# Patient Record
Sex: Female | Born: 1990
Health system: Southern US, Community
[De-identification: ages and names within clinical notes are randomized; demographics above are authoritative.]

---

## 2011-01-25 ENCOUNTER — Encounter: Payer: Self-pay | Admitting: *Deleted

## 2011-01-25 ENCOUNTER — Emergency Department (HOSPITAL_BASED_OUTPATIENT_CLINIC_OR_DEPARTMENT_OTHER)
Admission: EM | Admit: 2011-01-25 | Discharge: 2011-01-25 | Disposition: A | Discharge status: Home / Self Care | Payer: Self-pay | Attending: Emergency Medicine | Admitting: Emergency Medicine

## 2011-01-25 DIAGNOSIS — J029 Acute pharyngitis, unspecified: Secondary | ICD-10-CM | POA: Insufficient documentation

## 2011-01-25 DIAGNOSIS — J45909 Unspecified asthma, uncomplicated: Secondary | ICD-10-CM | POA: Insufficient documentation

## 2011-01-25 LAB — CBC
HCT: 38 % (ref 36.0–46.0)
Hemoglobin: 12.8 g/dL (ref 12.0–15.0)
MCH: 29.8 pg (ref 26.0–34.0)
MCV: 88.4 fL (ref 78.0–100.0)
RBC: 4.3 MIL/uL (ref 3.87–5.11)

## 2011-01-25 MED ORDER — PENICILLIN V POTASSIUM 500 MG PO TABS: 500.0000 mg | ORAL_TABLET | Freq: Four times a day (QID) | ORAL | Status: AC

## 2011-01-25 NOTE — ED Notes (Signed)
Pt c/o sore throat x 2 weeks
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
## 2013-09-23 ENCOUNTER — Ambulatory Visit: Payer: Self-pay | Admitting: Cardiovascular Disease

## 2015-12-28 ENCOUNTER — Encounter (HOSPITAL_COMMUNITY): Payer: Self-pay | Admitting: Emergency Medicine

## 2015-12-28 ENCOUNTER — Emergency Department (HOSPITAL_COMMUNITY)
Admission: EM | Admit: 2015-12-28 | Discharge: 2015-12-28 | Disposition: A | Discharge status: Home / Self Care | Payer: BLUE CROSS/BLUE SHIELD | Attending: Emergency Medicine | Admitting: Emergency Medicine

## 2015-12-28 DIAGNOSIS — H109 Unspecified conjunctivitis: Secondary | ICD-10-CM | POA: Insufficient documentation

## 2015-12-28 DIAGNOSIS — J45909 Unspecified asthma, uncomplicated: Secondary | ICD-10-CM | POA: Diagnosis not present

## 2015-12-28 DIAGNOSIS — T7840XA Allergy, unspecified, initial encounter: Secondary | ICD-10-CM

## 2015-12-28 LAB — CBC WITH DIFFERENTIAL/PLATELET
BASOS ABS: 0 10*3/uL (ref 0.0–0.1)
Basophils Relative: 0 %
EOS PCT: 1 %
Eosinophils Absolute: 0.1 10*3/uL (ref 0.0–0.7)
HEMATOCRIT: 42.3 % (ref 36.0–46.0)
HEMOGLOBIN: 13.8 g/dL (ref 12.0–15.0)
LYMPHS ABS: 2.4 10*3/uL (ref 0.7–4.0)
LYMPHS PCT: 41 %
MCH: 30.2 pg (ref 26.0–34.0)
MCHC: 32.6 g/dL (ref 30.0–36.0)
MCV: 92.6 fL (ref 78.0–100.0)
Monocytes Absolute: 0.4 10*3/uL (ref 0.1–1.0)
Monocytes Relative: 7 %
NEUTROS ABS: 2.9 10*3/uL (ref 1.7–7.7)
Neutrophils Relative %: 51 %
PLATELETS: 299 10*3/uL (ref 150–400)
RBC: 4.57 MIL/uL (ref 3.87–5.11)
RDW: 13.2 % (ref 11.5–15.5)
WBC: 5.8 10*3/uL (ref 4.0–10.5)

## 2015-12-28 LAB — BASIC METABOLIC PANEL
ANION GAP: 9 (ref 5–15)
CHLORIDE: 104 mmol/L (ref 101–111)
CO2: 21 mmol/L — AB (ref 22–32)
Calcium: 8.7 mg/dL — ABNORMAL LOW (ref 8.9–10.3)
Creatinine, Ser: 0.84 mg/dL (ref 0.44–1.00)
GFR calc Af Amer: >60 mL/min (ref >60)
GLUCOSE: 82 mg/dL (ref 65–99)
POTASSIUM: 3.5 mmol/L (ref 3.5–5.1)
Sodium: 134 mmol/L — ABNORMAL LOW (ref 135–145)

## 2015-12-28 LAB — I-STAT BETA HCG BLOOD, ED (MC, WL, AP ONLY): I-stat hCG, quantitative: <5 m[IU]/mL (ref <5)

## 2015-12-28 MED ORDER — PREDNISONE 10 MG PO TABS: 20.0000 mg | ORAL_TABLET | Freq: Two times a day (BID) | ORAL | 0 refills | Status: DC

## 2015-12-28 MED ORDER — SULFACETAMIDE SODIUM 10 % OP SOLN: 1.0000 [drp] | Freq: Four times a day (QID) | OPHTHALMIC | 0 refills | Status: DC

## 2015-12-28 MED ORDER — ONDANSETRON HCL 8 MG PO TABS: 8.0000 mg | ORAL_TABLET | ORAL | 0 refills | Status: DC | PRN

## 2015-12-28 MED ORDER — EPINEPHRINE HCL 1 MG/ML IJ SOLN
0.3000 mg | Freq: Once | INTRAMUSCULAR | Status: AC
Start: 2015-12-28 — End: 2015-12-28
  Administered 2015-12-28: 0.3 mg via SUBCUTANEOUS
  Filled 2015-12-28: qty 1

## 2015-12-28 MED ORDER — DIPHENHYDRAMINE HCL 50 MG/ML IJ SOLN
25.0000 mg | Freq: Once | INTRAMUSCULAR | Status: AC
  Administered 2015-12-28: 25 mg via INTRAVENOUS
  Filled 2015-12-28: qty 1

## 2015-12-28 MED ORDER — ONDANSETRON HCL 4 MG/2ML IJ SOLN
4.0000 mg | Freq: Once | INTRAMUSCULAR | Status: AC
  Administered 2015-12-28: 4 mg via INTRAVENOUS
  Filled 2015-12-28: qty 2

## 2015-12-28 MED ORDER — METHYLPREDNISOLONE SODIUM SUCC 125 MG IJ SOLR
125.0000 mg | Freq: Once | INTRAMUSCULAR | Status: AC
  Administered 2015-12-28: 125 mg via INTRAVENOUS
  Filled 2015-12-28: qty 2

## 2015-12-28 NOTE — Discharge Instructions (Signed)
Gentamicin eyedrops as prescribed.  Prednisone as prescribed.  Take Benadryl 25 mg every 6 hours for the next 2-3 days.  Zofran as prescribed as needed for nausea.  Return to the ER if your symptoms significantly worsen or change.

## 2015-12-28 NOTE — ED Notes (Signed)
Pt reports understanding of discharge information. No questions at time of discharge 

## 2015-12-28 NOTE — ED Triage Notes (Signed)
Pt states that she started having an allergic reaction tonight approx. 1 hour ago. States she did take tramadol tonight because she has pink eye. SOB. Alert and oriented.

## 2015-12-28 NOTE — ED Provider Notes (Signed)
[facility]-EMERGENCY DEPT Provider Note   CSN: [id number] Arrival date & time: 12/28/15  0023  First Provider Contact:12:57 AM    By signing my name below, I, Casey Luna, attest that this documentation has been prepared under the direction and in the presence of Geoffery Lyons, MD.  Electronically Signed: Gillis Ends. Lyn Hollingshead, ED Scribe. 12/28/15. 1:41 AM.   History   Chief Complaint Chief Complaint  Patient presents with  . Allergic Reaction    HPI HPI Comments: Casey Luna is a 25 y.o. female who presents to the Emergency Department complaining of sudden onset, constant, worsening, urticaria that began about 2hrs PTA. Pt has associated throat itching. She reports having symptoms consistent with pink eye which began on 12/19/15. She was prescribed Tramadol by ophthalmologist due to viral infection. Pt reports using eye drops for eye redness with no relief. She consumed garlic which she believes she contributed to allergic reaction. No recent use of new detergents, lotions, or perfumes. She reports recently applying new bed sheets to her bed. Pt has not been wearing contact lenses due to eye symptoms. No alleviating factors noted.   The history is provided by the patient. No language interpreter was used.  Allergic Reaction  Presenting symptoms: itching, rash and swelling   Rash:    Location:  Neck, arm, back, abdomen and leg   Quality: redness     Onset quality:  Sudden   Progression:  Worsening Severity:  Moderate Duration:  2 hours Ineffective treatments:  None tried  Past Medical History:  Diagnosis Date  . Asthma     There are no active problems to display for this patient.   History reviewed. No pertinent surgical history.  OB History    No data available       Home Medications    Prior to Admission medications   Medication Sig Start Date End Date Taking? Authorizing Provider  albuterol (PROVENTIL HFA;VENTOLIN HFA) 108 (90 BASE) MCG/ACT inhaler Inhale 2  puffs into the lungs every 6 (six) hours as needed. Shortness of breath and wheezing    Historical Provider, MD    Family History History reviewed. No pertinent family history.  Social History Social History  Substance Use Topics  . Smoking status: Never Smoker  . Smokeless tobacco: Not on file  . Alcohol use No     Allergies   Review of patient's allergies indicates no known allergies.   Review of Systems Review of Systems  Eyes: Positive for discharge and redness.  Skin: Positive for color change, itching and rash.  Allergic/Immunologic: Positive for food allergies.  All other systems reviewed and are negative.   Physical Exam Updated Vital Signs BP 134/92 (BP Location: Right Arm)   Pulse 78   Temp 97.7 F (36.5 C) (Oral)   Resp 20   SpO2 98%   Physical Exam  Constitutional: She is oriented to person, place, and time. She appears well-developed and well-nourished. No distress.  HENT:  Head: Normocephalic and atraumatic.  Eyes: EOM are normal.  Neck: Normal range of motion.  Cardiovascular: Normal rate, regular rhythm and normal heart sounds.   Pulmonary/Chest: Effort normal and breath sounds normal.  Abdominal: Soft. She exhibits no distension. There is no tenderness.  Musculoskeletal: Normal range of motion.  Neurological: She is alert and oriented to person, place, and time.  Skin: Skin is warm and dry.  Urticarial rash to arms, legs, and torso.  Psychiatric: She has a normal mood and affect. Judgment normal.  Nursing note  and vitals reviewed.   ED Treatments / Results  DIAGNOSTIC STUDIES: Oxygen Saturation is 98% on RA, normal by my interpretation.    COORDINATION OF CARE: 1:05 AM-Discussed treatment plan with pt at bedside and pt agreed to plan.   Labs (all labs ordered are listed, but only abnormal results are displayed) Labs Reviewed - No data to display  Radiology No results found.  Procedures Procedures (including critical care  time)  Medications Ordered in ED Medications  methylPREDNISolone sodium succinate (SOLU-MEDROL) 125 mg/2 mL injection 125 mg (not administered)  diphenhydrAMINE (BENADRYL) injection 25 mg (not administered)  EPINEPHrine (ADRENALIN) injection 0.3 mg (not administered)    Initial Impression / Assessment and Plan / ED Course  I have reviewed the triage vital signs and the nursing notes.  Pertinent labs & imaging results that were available during my care of the patient were reviewed by me and considered in my medical decision making (see chart for details).  Clinical Course     Final Clinical Impressions(s) / ED Diagnoses   Final diagnoses:  None    New Prescriptions New Prescriptions   No medications on file   Patient presents with complaints of an allergic reaction. She broke out in hives this evening and itching. There is no airway involvement in her vital signs are stable. She reports being started recently on tramadol for red, swollen eyes which she was told was a viral conjunctivitis.  She was given IV steroids, Benadryl, and epinephrine and her rash has significantly improved. She is now feeling better. She does report feeling nauseated all week. Her pregnancy test is negative and laboratory studies are unremarkable.  I will prescribe medication for her nausea and I feel as though it is time to prescribe an antibiotic drop for her eyes. She has injected conjunctiva with purulent discharge. She is to follow-up with her primary Dr. if not improving.   I personally performed the services described in this documentation, which was scribed in my presence. The recorded information has been reviewed and is accurate.       Geoffery Lyons, MD 12/28/15 [phone]

## 2017-03-06 ENCOUNTER — Emergency Department (HOSPITAL_BASED_OUTPATIENT_CLINIC_OR_DEPARTMENT_OTHER): Payer: BLUE CROSS/BLUE SHIELD

## 2017-03-06 ENCOUNTER — Encounter (HOSPITAL_BASED_OUTPATIENT_CLINIC_OR_DEPARTMENT_OTHER): Payer: Self-pay

## 2017-03-06 ENCOUNTER — Emergency Department (HOSPITAL_BASED_OUTPATIENT_CLINIC_OR_DEPARTMENT_OTHER)
Admission: EM | Admit: 2017-03-06 | Discharge: 2017-03-06 | Disposition: A | Discharge status: Home / Self Care | Payer: BLUE CROSS/BLUE SHIELD | Attending: Emergency Medicine | Admitting: Emergency Medicine

## 2017-03-06 DIAGNOSIS — M545 Low back pain, unspecified: Secondary | ICD-10-CM

## 2017-03-06 DIAGNOSIS — Z79899 Other long term (current) drug therapy: Secondary | ICD-10-CM | POA: Diagnosis not present

## 2017-03-06 DIAGNOSIS — Y999 Unspecified external cause status: Secondary | ICD-10-CM | POA: Insufficient documentation

## 2017-03-06 DIAGNOSIS — S161XXA Strain of muscle, fascia and tendon at neck level, initial encounter: Secondary | ICD-10-CM | POA: Diagnosis not present

## 2017-03-06 DIAGNOSIS — Y939 Activity, unspecified: Secondary | ICD-10-CM | POA: Insufficient documentation

## 2017-03-06 DIAGNOSIS — S199XXA Unspecified injury of neck, initial encounter: Secondary | ICD-10-CM | POA: Diagnosis present

## 2017-03-06 DIAGNOSIS — Y929 Unspecified place or not applicable: Secondary | ICD-10-CM | POA: Diagnosis not present

## 2017-03-06 DIAGNOSIS — J45909 Unspecified asthma, uncomplicated: Secondary | ICD-10-CM | POA: Insufficient documentation

## 2017-03-06 MED ORDER — MELOXICAM 7.5 MG PO TABS: 7.5000 mg | ORAL_TABLET | Freq: Every day | ORAL | 0 refills | Status: DC

## 2017-03-06 MED ORDER — HYDROCODONE-ACETAMINOPHEN 5-325 MG PO TABS
1.0000 | ORAL_TABLET | Freq: Once | ORAL | Status: AC
  Administered 2017-03-06: 1 via ORAL
  Filled 2017-03-06: qty 1

## 2017-03-06 MED ORDER — CYCLOBENZAPRINE HCL 10 MG PO TABS: 10.0000 mg | ORAL_TABLET | Freq: Every evening | ORAL | 0 refills | Status: DC | PRN

## 2017-03-06 MED FILL — MELOXICAM 7.5 MG TABLET: 7.5 | 15 days supply | Qty: 15 | Fill #0

## 2017-03-06 MED FILL — CYCLOBENZAPRINE 10 MG TAB: 10 | 10 days supply | Qty: 10 | Fill #0
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
## 2017-03-06 NOTE — Discharge Instructions (Signed)
Please read instructions below. Talk with your PCP about any new medications.  You can take meloxicam daily as needed for pain.  You can take Flexeril at bedtime as needed for muscle spasm.  Drink plenty of water. Apply ice to your neck and back for 20 minutes at a time. You can also apply heat if this provides you with more relief.  Return to ER if worsening headache, vision changes, vomiting, new numbness or tingling in your arms or legs, inability to urinate, inability to hold your bowels, or weakness in your extremities.
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
## 2017-03-06 NOTE — ED Notes (Signed)
Patient transported to X-ray 

## 2020-02-26 ENCOUNTER — Other Ambulatory Visit (HOSPITAL_COMMUNITY): Payer: Self-pay | Admitting: Gastroenterology

## 2020-02-26 ENCOUNTER — Other Ambulatory Visit: Payer: Self-pay | Admitting: Gastroenterology

## 2020-02-26 DIAGNOSIS — R1011 Right upper quadrant pain: Secondary | ICD-10-CM

## 2020-03-05 ENCOUNTER — Other Ambulatory Visit: Payer: Self-pay

## 2020-03-05 ENCOUNTER — Ambulatory Visit (HOSPITAL_COMMUNITY)
Admission: RE | Admit: 2020-03-05 | Discharge: 2020-03-05 | Disposition: A | Discharge status: Home / Self Care | Payer: BC Managed Care – PPO | Source: Ambulatory Visit | Attending: Gastroenterology | Admitting: Gastroenterology

## 2020-03-05 DIAGNOSIS — R1011 Right upper quadrant pain: Secondary | ICD-10-CM | POA: Insufficient documentation

## 2020-03-12 ENCOUNTER — Other Ambulatory Visit: Payer: Self-pay

## 2020-03-12 ENCOUNTER — Encounter (HOSPITAL_COMMUNITY)
Admission: RE | Admit: 2020-03-12 | Discharge: 2020-03-12 | Disposition: A | Discharge status: Home / Self Care | Payer: BC Managed Care – PPO | Source: Ambulatory Visit | Attending: Gastroenterology | Admitting: Gastroenterology

## 2020-03-12 DIAGNOSIS — R1011 Right upper quadrant pain: Secondary | ICD-10-CM | POA: Insufficient documentation

## 2020-03-12 MED ORDER — TECHNETIUM TC 99M MEBROFENIN IV KIT
5.4000 | PACK | Freq: Once | INTRAVENOUS | Status: AC | PRN
  Administered 2020-03-12: 5.4 via INTRAVENOUS

## 2021-04-09 ENCOUNTER — Other Ambulatory Visit: Payer: Self-pay

## 2021-04-09 ENCOUNTER — Emergency Department (HOSPITAL_BASED_OUTPATIENT_CLINIC_OR_DEPARTMENT_OTHER)
Admission: EM | Admit: 2021-04-09 | Discharge: 2021-04-09 | Disposition: A | Discharge status: Home / Self Care | Payer: BC Managed Care – PPO | Attending: Emergency Medicine | Admitting: Emergency Medicine

## 2021-04-09 ENCOUNTER — Emergency Department (HOSPITAL_BASED_OUTPATIENT_CLINIC_OR_DEPARTMENT_OTHER): Payer: BC Managed Care – PPO

## 2021-04-09 DIAGNOSIS — M940 Chondrocostal junction syndrome [Tietze]: Secondary | ICD-10-CM | POA: Diagnosis not present

## 2021-04-09 DIAGNOSIS — R142 Eructation: Secondary | ICD-10-CM | POA: Diagnosis not present

## 2021-04-09 DIAGNOSIS — J45909 Unspecified asthma, uncomplicated: Secondary | ICD-10-CM | POA: Diagnosis not present

## 2021-04-09 DIAGNOSIS — R079 Chest pain, unspecified: Secondary | ICD-10-CM | POA: Diagnosis present

## 2021-04-09 DIAGNOSIS — R110 Nausea: Secondary | ICD-10-CM | POA: Insufficient documentation

## 2021-04-09 LAB — CBC WITH DIFFERENTIAL/PLATELET
Abs Immature Granulocytes: 0.01 10*3/uL (ref 0.00–0.07)
Basophils Absolute: 0 10*3/uL (ref 0.0–0.1)
Basophils Relative: 1 %
Eosinophils Absolute: 0.1 10*3/uL (ref 0.0–0.5)
Eosinophils Relative: 2 %
HCT: 35.8 % — ABNORMAL LOW (ref 36.0–46.0)
Hemoglobin: 11.8 g/dL — ABNORMAL LOW (ref 12.0–15.0)
Immature Granulocytes: 0 %
Lymphocytes Relative: 40 %
Lymphs Abs: 2.5 10*3/uL (ref 0.7–4.0)
MCH: 28.9 pg (ref 26.0–34.0)
MCHC: 33 g/dL (ref 30.0–36.0)
MCV: 87.7 fL (ref 80.0–100.0)
Monocytes Absolute: 0.4 10*3/uL (ref 0.1–1.0)
Monocytes Relative: 6 %
Neutro Abs: 3.1 10*3/uL (ref 1.7–7.7)
Neutrophils Relative %: 51 %
Platelets: 318 10*3/uL (ref 150–400)
RBC: 4.08 MIL/uL (ref 3.87–5.11)
RDW: 13.2 % (ref 11.5–15.5)
WBC: 6.1 10*3/uL (ref 4.0–10.5)
nRBC: 0 % (ref 0.0–0.2)

## 2021-04-09 LAB — BASIC METABOLIC PANEL
Anion gap: 6 (ref 5–15)
BUN: 10 mg/dL (ref 6–20)
CO2: 25 mmol/L (ref 22–32)
Calcium: 8.7 mg/dL — ABNORMAL LOW (ref 8.9–10.3)
Chloride: 105 mmol/L (ref 98–111)
Creatinine, Ser: 0.9 mg/dL (ref 0.44–1.00)
GFR, Estimated: >60 mL/min (ref >60)
Glucose, Bld: 87 mg/dL (ref 70–99)
Potassium: 3.7 mmol/L (ref 3.5–5.1)
Sodium: 136 mmol/L (ref 135–145)

## 2021-04-09 LAB — TROPONIN I (HIGH SENSITIVITY): Troponin I (High Sensitivity): <2 ng/L (ref <18)

## 2021-04-09 MED ORDER — ONDANSETRON HCL 4 MG/2ML IJ SOLN
4.0000 mg | Freq: Once | INTRAMUSCULAR | Status: AC
  Administered 2021-04-09: 4 mg via INTRAVENOUS
  Filled 2021-04-09: qty 2

## 2021-04-09 MED ORDER — ONDANSETRON 8 MG PO TBDP: 8.0000 mg | ORAL_TABLET | Freq: Three times a day (TID) | ORAL | 0 refills | Status: AC | PRN

## 2021-04-09 MED ORDER — NAPROXEN 375 MG PO TABS: ORAL_TABLET | ORAL | 0 refills | Status: AC

## 2021-04-09 MED ORDER — KETOROLAC TROMETHAMINE 15 MG/ML IJ SOLN
15.0000 mg | Freq: Once | INTRAMUSCULAR | Status: AC
  Administered 2021-04-09: 15 mg via INTRAVENOUS
  Filled 2021-04-09: qty 1

## 2021-04-09 MED ORDER — PANTOPRAZOLE SODIUM 40 MG IV SOLR
40.0000 mg | Freq: Once | INTRAVENOUS | Status: AC
  Administered 2021-04-09: 40 mg via INTRAVENOUS
  Filled 2021-04-09: qty 40

## 2021-04-09 NOTE — ED Triage Notes (Signed)
Pt c/o chest pain for 2-3 weeks, but started to get worse yesterday. Pt describes pain as a discomfort that radiates towards left arm.

## 2021-04-09 NOTE — ED Provider Notes (Signed)
[facility] Provider Note: Casey Dell, MD, FACEP  CSN: [id number] MRN: [id number] ARRIVAL: 04/09/21 at 0454 ROOM: MH09/[facility]09   CHIEF COMPLAINT  Chest Pain   HISTORY OF PRESENT ILLNESS  04/09/21 5:07 AM Casey Luna is a 30 y.o. female who has had general malaise and fatigue for about 2 to 3 weeks.  She has had an occasional cough with this.  She is also having nausea and belching.  She has been having intermittent pain in her left chest.  It is localized along the left sternal border but radiates to the left arm.  It is intermittent.  It is vaguely described but has both sharp and dull components.  It is it is worse with palpation or movements.  Is worse when lying on her left side.  It is worse with taking deep breaths and when the pain occurs she feels somewhat short of breath.  She rates it as a 7 out of 10 currently.   Past Medical History:  Diagnosis Date   Asthma     No past surgical history on file.  No family history on file.  Social History   Tobacco Use   Smoking status: Never  Substance Use Topics   Alcohol use: No   Drug use: No    Prior to Admission medications   Medication Sig Start Date End Date Taking? Authorizing Provider  naproxen (NAPROSYN) 375 MG tablet Take 1 tablet twice daily as needed for chest wall pain.  Take with food if stomach upset occurs. 04/09/21  Yes Macyn Shropshire, MD  ondansetron (ZOFRAN ODT) 8 MG disintegrating tablet Take 1 tablet (8 mg total) by mouth every 8 (eight) hours as needed for nausea or vomiting. 04/09/21  Yes Drey Shaff, MD    Allergies Patient has no known allergies.   REVIEW OF SYSTEMS  Negative except as noted here or in the History of Present Illness.   PHYSICAL EXAMINATION  Initial Vital Signs Blood pressure (!) 138/108, pulse 67, temperature 98.5 F (36.9 C), resp. rate 18, height 4\' 11"  (1.499 m), weight 63.5 kg, last menstrual period 04/06/2021, SpO2 100 %.  Examination General:  Well-developed, well-nourished female in no acute distress; appearance consistent with age of record HENT: normocephalic; atraumatic Eyes: Normal appearance Neck: supple Heart: regular rate and rhythm Lungs: clear to auscultation bilaterally Chest: Left parasternal tenderness Abdomen: soft; nondistended; nontender; bowel sounds present Extremities: No deformity; full range of motion; pulses normal Neurologic: Awake, alert and oriented; motor function intact in all extremities and symmetric; no facial droop Skin: Warm and dry Psychiatric: Normal mood and affect   RESULTS  Summary of this visit's results, reviewed and interpreted by myself:   EKG Interpretation  Date/Time:  Friday April 09 2021 05:03:05 EDT Ventricular Rate:  72 PR Interval:  140 QRS Duration: 87 QT Interval:  379 QTC Calculation: 415 R Axis:   76 Text Interpretation: Sinus rhythm Normal ECG No previous ECGs available Confirmed by Tyjanae Bartek, 11-26-1971 (Jonny Ruiz) on 04/09/2021 5:07:08 AM       Laboratory Studies: Results for orders placed or performed during the hospital encounter of 04/09/21 (from the past 24 hour(s))  CBC with Differential/Platelet     Status: Abnormal   Collection Time: 04/09/21  5:26 AM  Result Value Ref Range   WBC 6.1 4.0 - 10.5 K/uL   RBC 4.08 3.87 - 5.11 MIL/uL   Hemoglobin 11.8 (L) 12.0 - 15.0 g/dL   HCT 13/04/22 (L) 44.3 - 15.4 %   MCV 87.7  80.0 - 100.0 fL   MCH 28.9 26.0 - 34.0 pg   MCHC 33.0 30.0 - 36.0 g/dL   RDW 69.6 78.9 - 38.1 %   Platelets 318 150 - 400 K/uL   nRBC 0.0 0.0 - 0.2 %   Neutrophils Relative % 51 %   Neutro Abs 3.1 1.7 - 7.7 K/uL   Lymphocytes Relative 40 %   Lymphs Abs 2.5 0.7 - 4.0 K/uL   Monocytes Relative 6 %   Monocytes Absolute 0.4 0.1 - 1.0 K/uL   Eosinophils Relative 2 %   Eosinophils Absolute 0.1 0.0 - 0.5 K/uL   Basophils Relative 1 %   Basophils Absolute 0.0 0.0 - 0.1 K/uL   Immature Granulocytes 0 %   Abs Immature Granulocytes 0.01 0.00 - 0.07 K/uL   Basic metabolic panel     Status: Abnormal   Collection Time: 04/09/21  5:26 AM  Result Value Ref Range   Sodium 136 135 - 145 mmol/L   Potassium 3.7 3.5 - 5.1 mmol/L   Chloride 105 98 - 111 mmol/L   CO2 25 22 - 32 mmol/L   Glucose, Bld 87 70 - 99 mg/dL   BUN 10 6 - 20 mg/dL   Creatinine, Ser 0.17 0.44 - 1.00 mg/dL   Calcium 8.7 (L) 8.9 - 10.3 mg/dL   GFR, Estimated >51 >02 mL/min   Anion gap 6 5 - 15  Troponin I (High Sensitivity)     Status: None   Collection Time: 04/09/21  5:26 AM  Result Value Ref Range   Troponin I (High Sensitivity) <2 <18 ng/L   Imaging Studies: DG Chest 2 View  Result Date: 04/09/2021 CLINICAL DATA:  30 year old female with history of chest pain for the past 2-3 weeks worsened yesterday. EXAM: CHEST - 2 VIEW COMPARISON:  Chest x-ray 01/24/2021. FINDINGS: Lung volumes are normal. No consolidative airspace disease. No pleural effusions. No pneumothorax. No pulmonary nodule or mass noted. Pulmonary vasculature and the cardiomediastinal silhouette are within normal limits. IMPRESSION: No radiographic evidence of acute cardiopulmonary disease. Electronically Signed   By: Trudie Reed M.D.   On: 04/09/2021 06:10    ED COURSE and MDM  Nursing notes, initial and subsequent vitals signs, including pulse oximetry, reviewed and interpreted by myself.  Vitals:   04/09/21 0503 04/09/21 0504 04/09/21 0600  BP: (!) 138/108  (!) 127/95  Pulse: 67  64  Resp: 18  16  Temp: 98.5 F (36.9 C)    SpO2: 100%  99%  Weight:  63.5 kg   Height:  4\' 11"  (1.499 m)    Medications  ketorolac (TORADOL) 15 MG/ML injection 15 mg (15 mg Intravenous Given 04/09/21 0522)  ondansetron (ZOFRAN) injection 4 mg (4 mg Intravenous Given 04/09/21 0541)  pantoprazole (PROTONIX) injection 40 mg (40 mg Intravenous Given 04/09/21 0541)   6:13 AM Chest pain improved with IV Toradol.  Examination and improvement with Toradol are consistent with chest wall pain, likely costochondritis associated  with a viral illness the effects of which are lingering.  Her nausea is improved with IV Zofran.   PROCEDURES  Procedures   ED DIAGNOSES     ICD-10-CM   1. Costochondritis  M94.0          Aylan Bayona, 13/4/22, MD 04/09/21 [phone]

## 2021-04-30 IMAGING — NM NM HEPATO W/GB/PHARM/[PERSON_NAME]
2 series · 12 of 12 positions shown · non-contrast
Comparison: Ultrasound 03/05/2020

CLINICAL DATA: Nausea post eating

EXAM:
NUCLEAR MEDICINE HEPATOBILIARY IMAGING WITH GALLBLADDER EF
TECHNIQUE: Sequential images of the abdomen were obtained [DATE] minutes
following intravenous administration of radiopharmaceutical. After
oral ingestion of Ensure, gallbladder ejection fraction was
determined. At 60 min, normal ejection fraction is greater than 33%.
RADIOPHARMACEUTICALS:  5.4 mCi 7c-PPm  Choletec IV

[he hepatobiliary · 4.52mm/px · 6 of 60 frames shown (1 of 2)]
[frame 6/60]
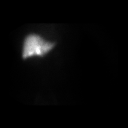
[frame 16/60]
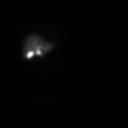
[frame 26/60]
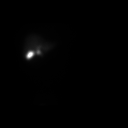
[frame 36/60]
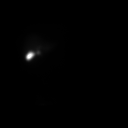
[frame 46/60]
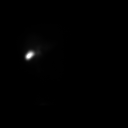
[frame 56/60]
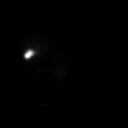

[he hepatobiliary · 4.52mm/px · 6 of 60 frames shown (2 of 2)]
[frame 6/60]
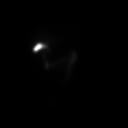
[frame 16/60]
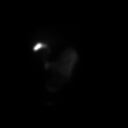
[frame 26/60]
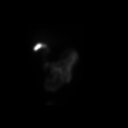
[frame 36/60]
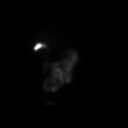
[frame 46/60]
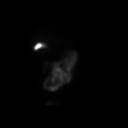
[frame 56/60]
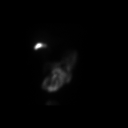

[12 of 12 positions shown; findings below may reference images not displayed]

FINDINGS: Prompt uptake and biliary excretion of activity by the liver is
seen. Gallbladder activity is visualized, consistent with patency of
cystic duct. Biliary activity passes into small bowel, consistent
with patent common bile duct.

Calculated gallbladder ejection fraction is 68%. (Normal gallbladder
ejection fraction with Ensure is greater than 33%.)
IMPRESSION: Negative examination

## 2022-05-28 IMAGING — DX DG CHEST 2V
2 series · 2 of 2 positions shown · non-contrast
Comparison: Chest x-ray 01/24/2021.

CLINICAL DATA: 30-year-old female with history of chest pain for
the past 2-3 weeks worsened yesterday.

EXAM:
CHEST - 2 VIEW

[chest pa]
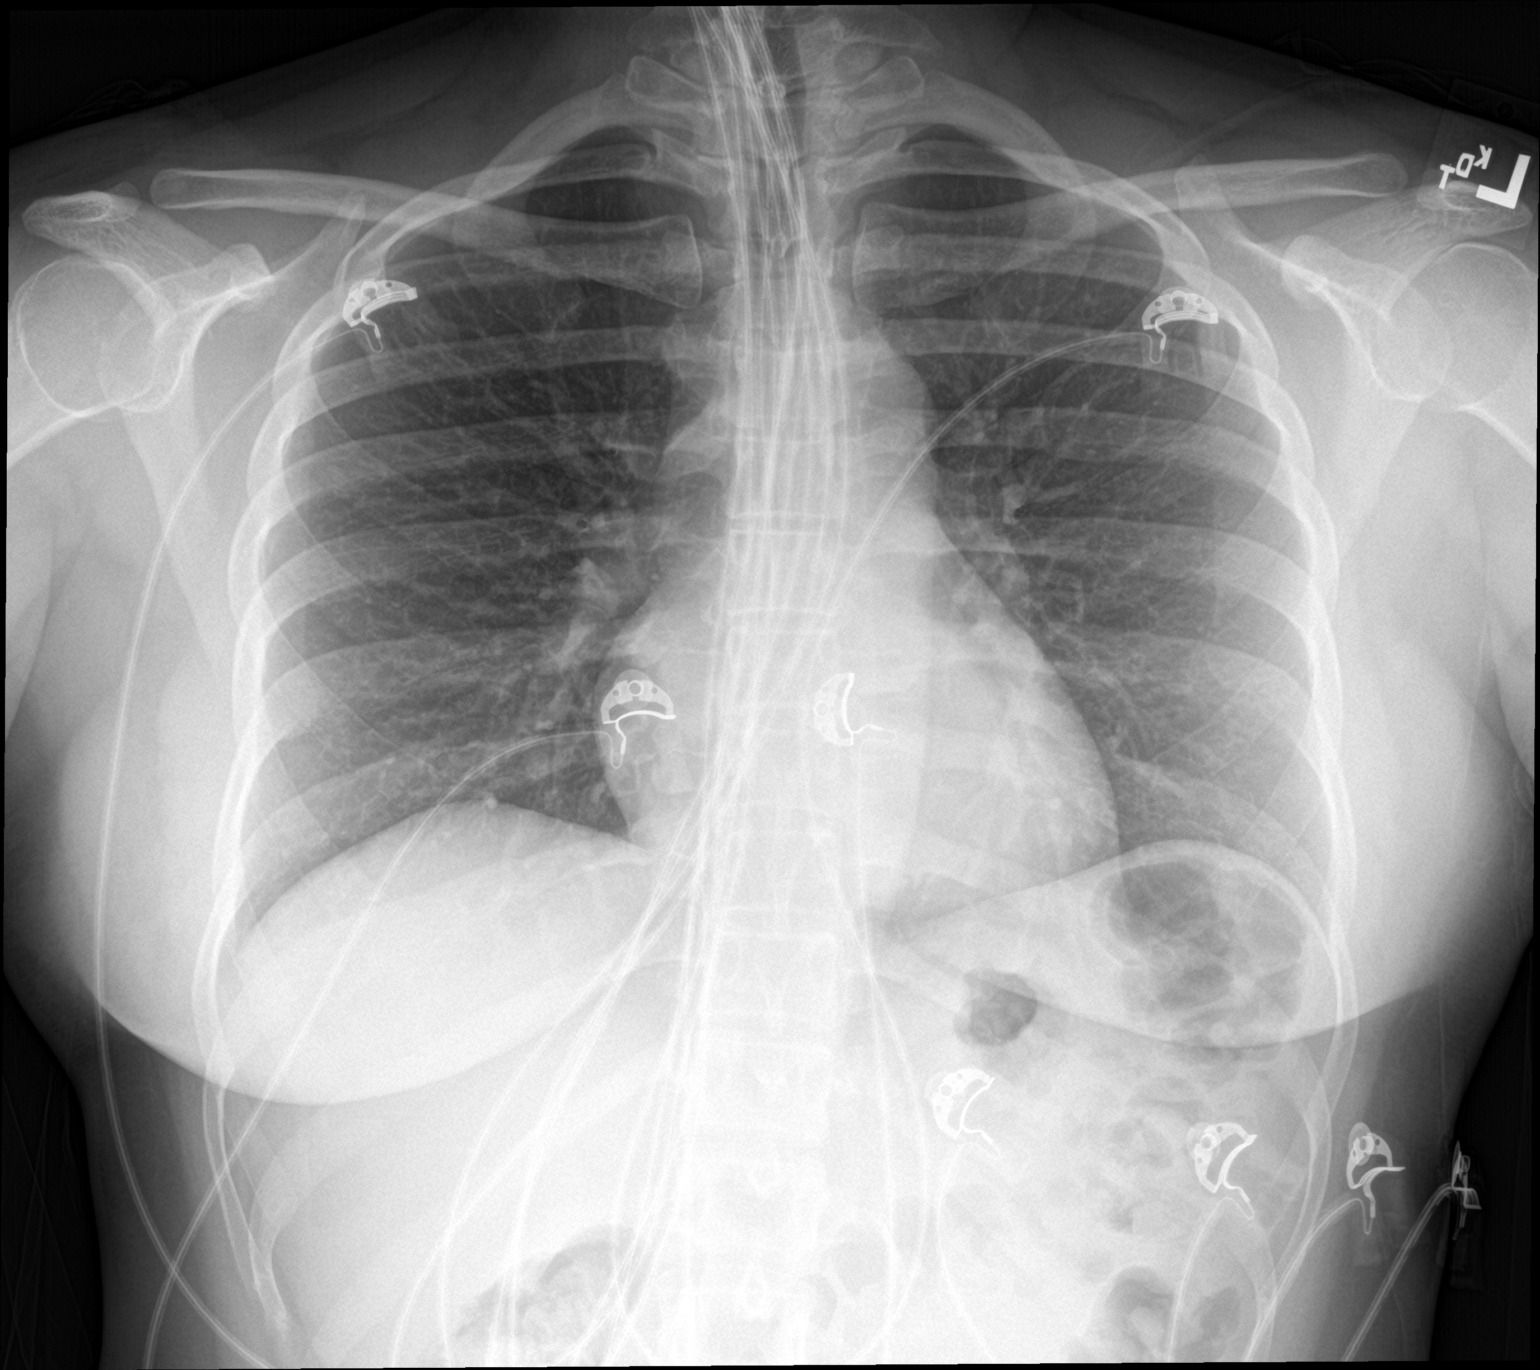

[chest lat]
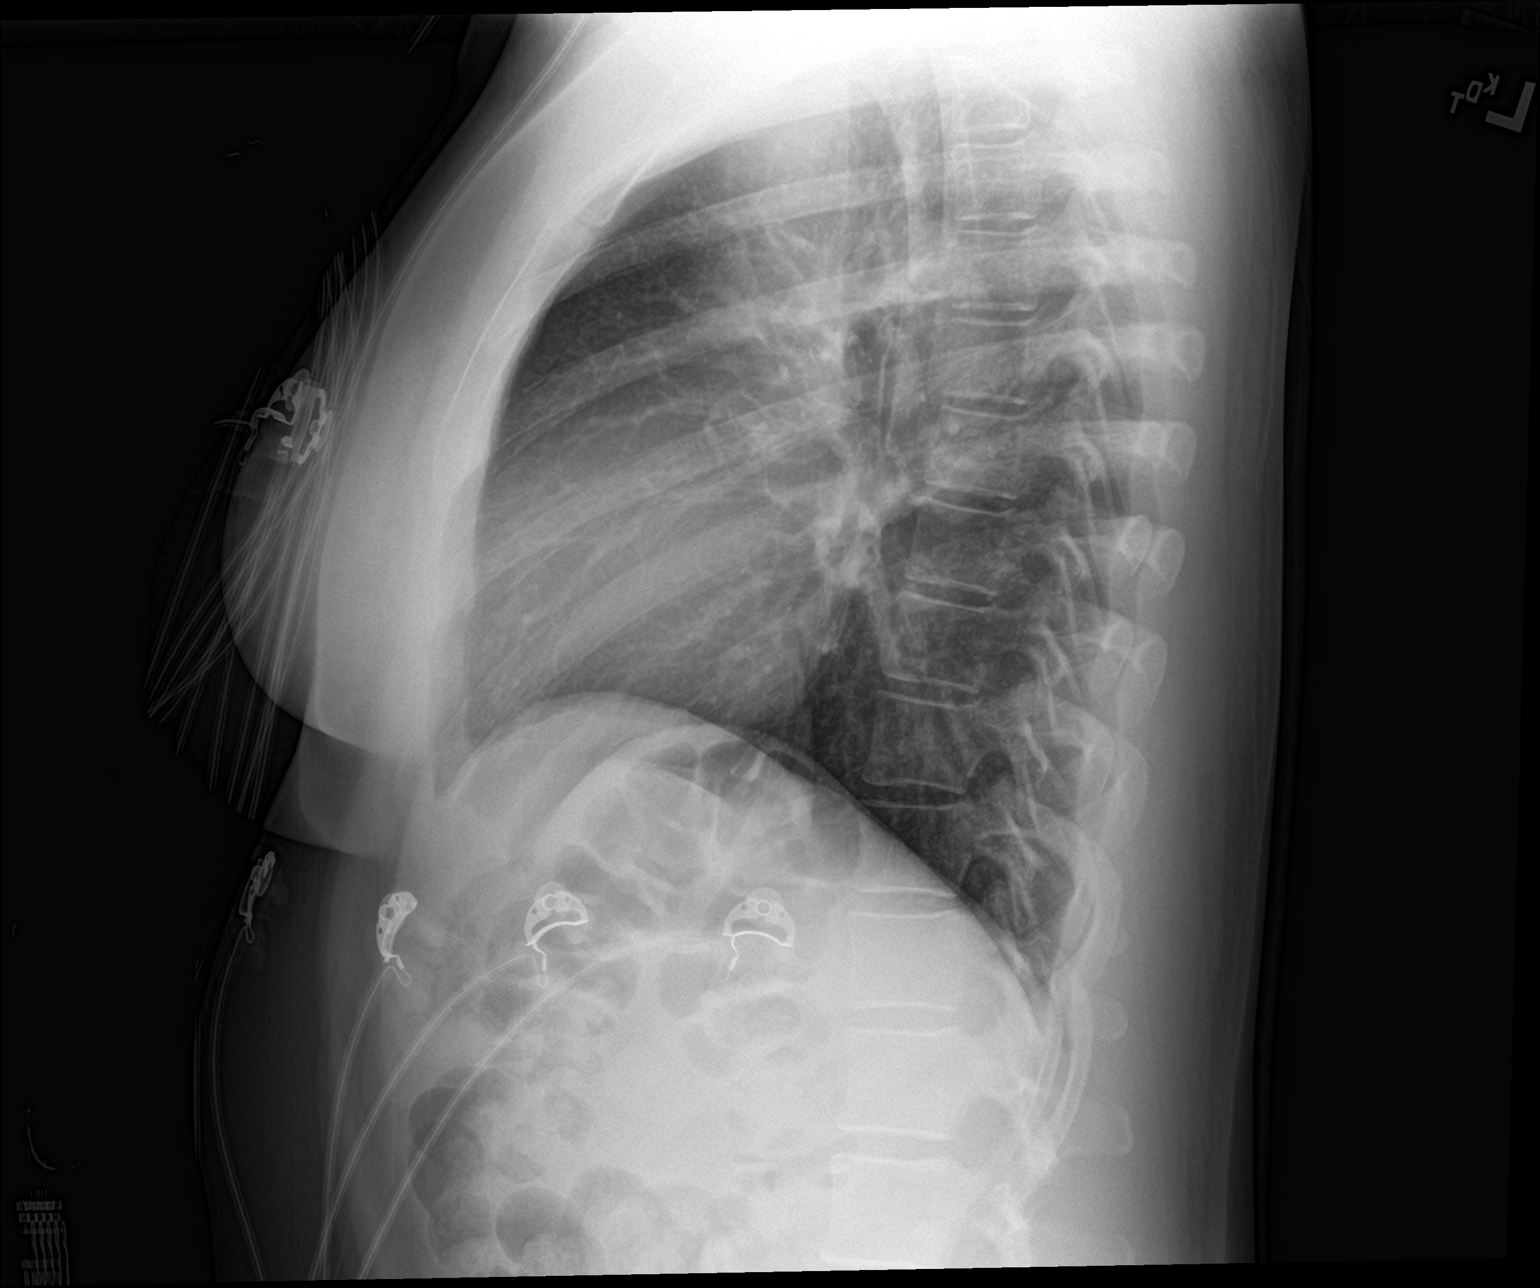

[2 of 2 positions shown; findings below may reference images not displayed]

FINDINGS: Lung volumes are normal. No consolidative airspace disease. No
pleural effusions. No pneumothorax. No pulmonary nodule or mass
noted. Pulmonary vasculature and the cardiomediastinal silhouette
are within normal limits.
IMPRESSION: No radiographic evidence of acute cardiopulmonary disease.

## 2022-08-13 ENCOUNTER — Encounter (HOSPITAL_BASED_OUTPATIENT_CLINIC_OR_DEPARTMENT_OTHER): Payer: Self-pay | Admitting: Emergency Medicine

## 2022-08-13 ENCOUNTER — Emergency Department (HOSPITAL_BASED_OUTPATIENT_CLINIC_OR_DEPARTMENT_OTHER)
Admission: EM | Admit: 2022-08-13 | Discharge: 2022-08-13 | Disposition: A | Discharge status: Home / Self Care | Payer: BC Managed Care – PPO | Attending: Emergency Medicine | Admitting: Emergency Medicine

## 2022-08-13 DIAGNOSIS — H9201 Otalgia, right ear: Secondary | ICD-10-CM | POA: Diagnosis present

## 2022-08-13 DIAGNOSIS — H6001 Abscess of right external ear: Secondary | ICD-10-CM

## 2022-08-13 MED ORDER — DOXYCYCLINE HYCLATE 100 MG PO CAPS
100.0000 mg | ORAL_CAPSULE | Freq: Two times a day (BID) | ORAL | 0 refills | Status: AC
Start: 2022-08-13 — End: ?

## 2022-08-13 NOTE — Discharge Instructions (Signed)
You are seen today for an abscess of your outer ear.  You are being with antibiotics.  Keep the area clean and dry.  If you have increased pain or swelling you need to be seen as you may need additional antibiotics.  Otherwise follow-up with your PCP and/or ENT.  Come back if you have fever, chills, worsening pain or swelling or other worrisome changes.

## 2022-08-13 NOTE — ED Triage Notes (Signed)
Pt reports she had a knot on the inside of her R ear a while ago. Today woke up with bleeding from R ear. Denies any hearing issues. Also complains of vertigo.

## 2022-08-13 NOTE — ED Provider Notes (Signed)
[facility] [place] Provider Note   CSN: [id number] Arrival date & time: 08/13/22  1308     History  Chief Complaint  Patient presents with   Ear Drainage    Casey Luna is a 32 y.o. female.  She denies any chronic medical conditions.  She presents the ER complaining of right ear pain has been lateral cough for weeks but she states that today she woke up with blood and pus drained on her pillow was concerned.  Denies loss of hearing, no fevers or chills.   Ear Drainage       Home Medications Prior to Admission medications   Medication Sig Start Date End Date Taking? Authorizing Provider  doxycycline (VIBRAMYCIN) 100 MG capsule Take 1 capsule (100 mg total) by mouth 2 (two) times daily. 08/13/22  Yes Skii Cleland A, PA-C  naproxen (NAPROSYN) 375 MG tablet Take 1 tablet twice daily as needed for chest wall pain.  Take with food if stomach upset occurs. 04/09/21   Molpus, John, MD  ondansetron (ZOFRAN ODT) 8 MG disintegrating tablet Take 1 tablet (8 mg total) by mouth every 8 (eight) hours as needed for nausea or vomiting. 04/09/21   Molpus, Jenny Reichmann, MD      Allergies    Patient has no known allergies.    Review of Systems   Review of Systems  Physical Exam Updated Vital Signs BP (!) 131/96 (BP Location: Left Arm)   Pulse 81   Temp 98.4 F (36.9 C) (Oral)   Resp 17   Ht '4\' 11"'$  (1.499 m)   Wt 68 kg   SpO2 99%   BMI 30.30 kg/m  Physical Exam Vitals and nursing note reviewed.  Constitutional:      General: She is not in acute distress.    Appearance: She is well-developed.  HENT:     Head: Normocephalic and atraumatic.     Right Ear: Tympanic membrane normal. No swelling. A foreign body is present. No mastoid tenderness. Tympanic membrane is not perforated or erythematous.     Left Ear: Tympanic membrane and ear canal normal.     Ears:     Comments: Very small pustule at the very outer aspect of the right ear canal with dried  blood and mild surrounding erythema and swelling.  No tenderness or swelling to the pinna or tragus Eyes:     Conjunctiva/sclera: Conjunctivae normal.  Cardiovascular:     Rate and Rhythm: Normal rate and regular rhythm.     Heart sounds: No murmur heard. Pulmonary:     Effort: Pulmonary effort is normal. No respiratory distress.     Breath sounds: Normal breath sounds.  Abdominal:     Palpations: Abdomen is soft.     Tenderness: There is no abdominal tenderness.  Musculoskeletal:        General: No swelling.     Cervical back: Neck supple.  Skin:    General: Skin is warm and dry.     Capillary Refill: Capillary refill takes less than 2 seconds.  Neurological:     Mental Status: She is alert.  Psychiatric:        Mood and Affect: Mood normal.     ED Results / Procedures / Treatments   Labs (all labs ordered are listed, but only abnormal results are displayed) Labs Reviewed - No data to display  EKG None  Radiology No results found.  Procedures Procedures    Medications Ordered in ED Medications -  No data to display  ED Course/ Medical Decision Making/ A&P                             Medical Decision Making DDX: Titus media, otitis externa, or TM rupture, abscess, other ED course: Patient has drainage from the right ear from a very small abscess with mild surrounding cellulitis.  There is no signs of chondritis, no swelling of the remainder of the ear canal.  This had to drain spontaneously at home.  Area was cleansed with alcohol and a 22-gauge needle inserted with no further drainage.  Gust with patient she can use warm washcloth to keep the area and make sure it stays dry.  Discussed we will start her on doxycycline as this is a very focal abscess and we will treat it like normal skin abscess in the absence of any chondritis or further canal swelling but if her symptoms are worsening at all she needs to return or follow-up with ENT as she may need different  antibiotics such as Cipro.  She verbalized understanding is agreeable plan of care and discharge.  Risk Prescription drug management.           Final Clinical Impression(s) / ED Diagnoses Final diagnoses:  Abscess of right ear canal    Rx / DC Orders ED Discharge Orders          Ordered    doxycycline (VIBRAMYCIN) 100 MG capsule  2 times daily        08/13/22 [address] PA-C 08/13/22 1547    Cristie Hem, MD 08/14/22 1511

## 2024-01-24 ENCOUNTER — Emergency Department (HOSPITAL_BASED_OUTPATIENT_CLINIC_OR_DEPARTMENT_OTHER)

## 2024-01-24 ENCOUNTER — Encounter (HOSPITAL_BASED_OUTPATIENT_CLINIC_OR_DEPARTMENT_OTHER): Payer: Self-pay | Admitting: Emergency Medicine

## 2024-01-24 ENCOUNTER — Emergency Department (HOSPITAL_BASED_OUTPATIENT_CLINIC_OR_DEPARTMENT_OTHER)
Admission: EM | Admit: 2024-01-24 | Discharge: 2024-01-24 | Disposition: A | Discharge status: Home / Self Care | Attending: Emergency Medicine | Admitting: Emergency Medicine

## 2024-01-24 ENCOUNTER — Other Ambulatory Visit: Payer: Self-pay

## 2024-01-24 DIAGNOSIS — S93401A Sprain of unspecified ligament of right ankle, initial encounter: Secondary | ICD-10-CM | POA: Diagnosis not present

## 2024-01-24 DIAGNOSIS — X501XXA Overexertion from prolonged static or awkward postures, initial encounter: Secondary | ICD-10-CM | POA: Insufficient documentation

## 2024-01-24 DIAGNOSIS — S99911A Unspecified injury of right ankle, initial encounter: Secondary | ICD-10-CM | POA: Diagnosis present

## 2024-01-24 MED ORDER — ACETAMINOPHEN 500 MG PO TABS
1000.0000 mg | ORAL_TABLET | Freq: Once | ORAL | Status: AC
  Administered 2024-01-24: 1000 mg via ORAL
  Filled 2024-01-24: qty 2

## 2024-01-24 MED ORDER — IBUPROFEN 800 MG PO TABS
800.0000 mg | ORAL_TABLET | Freq: Once | ORAL | Status: AC
  Administered 2024-01-24: 800 mg via ORAL
  Filled 2024-01-24: qty 1

## 2024-01-24 NOTE — ED Triage Notes (Addendum)
 Pt states twisted right ankle last night about 20:00, has been getting worse since, difficulty walking on it now.

## 2024-01-24 NOTE — ED Notes (Signed)
 XR at bedside

## 2024-01-24 NOTE — ED Notes (Signed)
 Pt notes she was coming down the steps and had a slip like motion. Unsure but possibly confirms inversion of same.  No swelling or discoloration noted at this time.

## 2024-01-24 NOTE — ED Provider Notes (Signed)
  EMERGENCY DEPARTMENT AT [facility] [place] Provider Note   CSN: [id number] Arrival date & time: 01/24/24  9645     Patient presents with: Ankle Pain   Casey Luna is a 33 y.o. female.   Presents to the emergency department for evaluation of right ankle injury.  Patient reports twisting her ankle earlier and the pain has progressively worsened.       Prior to Admission medications   Medication Sig Start Date End Date Taking? Authorizing Provider  doxycycline  (VIBRAMYCIN ) 100 MG capsule Take 1 capsule (100 mg total) by mouth 2 (two) times daily. 08/13/22   Suellen Cantor A, PA-C  naproxen  (NAPROSYN ) 375 MG tablet Take 1 tablet twice daily as needed for chest wall pain.  Take with food if stomach upset occurs. 04/09/21   Molpus, John, MD  ondansetron  (ZOFRAN  ODT) 8 MG disintegrating tablet Take 1 tablet (8 mg total) by mouth every 8 (eight) hours as needed for nausea or vomiting. 04/09/21   Molpus, Norleen, MD    Allergies: Patient has no known allergies.    Review of Systems  Updated Vital Signs BP (!) 131/95 (BP Location: Left Arm)   Pulse 75   Temp 98 F (36.7 C) (Oral)   Resp 18   Ht 4' 11 (1.499 m)   Wt 68 kg   LMP 12/24/2023 (Approximate)   SpO2 98%   BMI 30.30 kg/m   Physical Exam Vitals and nursing note reviewed.  Constitutional:      Appearance: Normal appearance.  HENT:     Head: Atraumatic.  Cardiovascular:     Pulses:          Dorsalis pedis pulses are 2+ on the right side.  Musculoskeletal:     Right ankle: No swelling. Tenderness present. No base of 5th metatarsal or proximal fibula tenderness. Decreased range of motion.     Right Achilles Tendon: No defects. Thompson's test negative.     Right foot: Normal.  Neurological:     Mental Status: She is alert.     (all labs ordered are listed, but only abnormal results are displayed) Labs Reviewed - No data to display  EKG: None  Radiology: DG Ankle Complete Right Result Date:  01/24/2024 CLINICAL DATA:  Right ankle twisting injury. Difficulty weight-bearing. EXAM: RIGHT ANKLE - COMPLETE 3+ VIEW COMPARISON:  None Available. FINDINGS: There is no evidence of fracture, dislocation, or joint effusion. There is no evidence of arthropathy or other focal bone abnormality. There is mild circumferential soft tissue swelling extending over the hindfoot. IMPRESSION: Soft tissue swelling without evidence of fractures. Electronically Signed   By: Francis Quam M.D.   On: 01/24/2024 05:22     Procedures   Medications Ordered in the ED  ibuprofen  (ADVIL ) tablet 800 mg (has no administration in time range)  acetaminophen  (TYLENOL ) tablet 1,000 mg (has no administration in time range)                                    Medical Decision Making Amount and/or Complexity of Data Reviewed Radiology: ordered and independent interpretation performed. Decision-making details documented in ED Course.   Differential diagnosis considered includes, but not limited to: Fracture; sprain; strain; contusion  Presents with complaints of ankle pain after twisting her ankle.  No pain in other areas of the leg or joints.  X-ray does not show fracture.  Patient did complain of some pain  behind the heel but no evidence of Achilles injury.  Treat with immobilization, NSAIDs.     Final diagnoses:  Sprain of right ankle, unspecified ligament, initial encounter    ED Discharge Orders     None          Keonta Alsip, Lonni PARAS, MD 01/24/24 [phone]
